# Patient Record
Sex: Female | Born: 2000 | Race: White | Hispanic: No | Marital: Single | Smoking: Never smoker
Health system: Southern US, Community
[De-identification: ages and names within clinical notes are randomized; demographics above are authoritative.]

## PROBLEM LIST (undated history)

## (undated) DIAGNOSIS — R51 Headache: Secondary | ICD-10-CM

## (undated) DIAGNOSIS — F329 Major depressive disorder, single episode, unspecified: Secondary | ICD-10-CM

## (undated) DIAGNOSIS — T7840XA Allergy, unspecified, initial encounter: Secondary | ICD-10-CM

## (undated) DIAGNOSIS — H539 Unspecified visual disturbance: Secondary | ICD-10-CM

## (undated) DIAGNOSIS — F419 Anxiety disorder, unspecified: Secondary | ICD-10-CM

## (undated) DIAGNOSIS — R519 Headache, unspecified: Secondary | ICD-10-CM

## (undated) DIAGNOSIS — J45909 Unspecified asthma, uncomplicated: Secondary | ICD-10-CM

## (undated) DIAGNOSIS — N390 Urinary tract infection, site not specified: Secondary | ICD-10-CM

## (undated) DIAGNOSIS — Z8489 Family history of other specified conditions: Secondary | ICD-10-CM

## (undated) DIAGNOSIS — F32A Depression, unspecified: Secondary | ICD-10-CM

## (undated) DIAGNOSIS — K589 Irritable bowel syndrome without diarrhea: Secondary | ICD-10-CM

## (undated) DIAGNOSIS — R198 Other specified symptoms and signs involving the digestive system and abdomen: Secondary | ICD-10-CM

---
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
## 2021-02-11 LAB — BASIC METABOLIC PANEL
BUN: 11 (ref 4–21)
CO2: 18 (ref 13–22)
Chloride: 107 (ref 99–108)
Creatinine: 0.7 (ref 0.5–1.1)
Glucose: 76
Potassium: 3.9 (ref 3.4–5.3)
Sodium: 139 (ref 137–147)

## 2021-02-11 LAB — HEPATIC FUNCTION PANEL
ALT: 14 (ref 3–30)
AST: 16 (ref 2–40)
Alkaline Phosphatase: 84 (ref 25–125)
Bilirubin, Total: 0.2

## 2021-02-11 LAB — COMPREHENSIVE METABOLIC PANEL
Albumin: 4.2 (ref 3.5–5.0)
Calcium: 9.4 (ref 8.7–10.7)
Globulin: 2.4

## 2021-02-11 LAB — CBC AND DIFFERENTIAL
HCT: 35 — AB (ref 36–46)
Hemoglobin: 11.2 — AB (ref 12.0–16.0)
Platelets: 277 (ref 150–399)
WBC: 9.1

## 2021-02-11 LAB — CBC: RBC: 4.07 (ref 3.87–5.11)

## 2021-04-28 ENCOUNTER — Other Ambulatory Visit: Payer: Self-pay

## 2021-04-28 ENCOUNTER — Encounter: Payer: Self-pay | Admitting: Nurse Practitioner

## 2021-05-20 ENCOUNTER — Encounter: Payer: Self-pay | Admitting: Nurse Practitioner

## 2021-05-20 ENCOUNTER — Other Ambulatory Visit (INDEPENDENT_AMBULATORY_CARE_PROVIDER_SITE_OTHER): Payer: Medicaid Other

## 2021-05-20 ENCOUNTER — Ambulatory Visit (INDEPENDENT_AMBULATORY_CARE_PROVIDER_SITE_OTHER): Payer: Medicaid Other | Admitting: Nurse Practitioner

## 2021-05-20 VITALS — BP 110/60 | HR 72 | Ht 60.5 in | Wt 190.1 lb

## 2021-05-20 DIAGNOSIS — K5900 Constipation, unspecified: Secondary | ICD-10-CM

## 2021-05-20 DIAGNOSIS — R112 Nausea with vomiting, unspecified: Secondary | ICD-10-CM | POA: Insufficient documentation

## 2021-05-20 DIAGNOSIS — D649 Anemia, unspecified: Secondary | ICD-10-CM

## 2021-05-20 DIAGNOSIS — R1031 Right lower quadrant pain: Secondary | ICD-10-CM

## 2021-05-20 DIAGNOSIS — R109 Unspecified abdominal pain: Secondary | ICD-10-CM

## 2021-05-20 DIAGNOSIS — R1013 Epigastric pain: Secondary | ICD-10-CM

## 2021-05-20 DIAGNOSIS — R1010 Upper abdominal pain, unspecified: Secondary | ICD-10-CM | POA: Insufficient documentation

## 2021-05-20 DIAGNOSIS — R197 Diarrhea, unspecified: Secondary | ICD-10-CM

## 2021-05-20 LAB — CBC WITH DIFFERENTIAL/PLATELET
Basophils Absolute: 0 10*3/uL (ref 0.0–0.1)
Basophils Relative: 0.5 % (ref 0.0–3.0)
Eosinophils Absolute: 0.2 10*3/uL (ref 0.0–0.7)
Eosinophils Relative: 2 % (ref 0.0–5.0)
HCT: 33.9 % — ABNORMAL LOW (ref 36.0–49.0)
Hemoglobin: 11.3 g/dL — ABNORMAL LOW (ref 12.0–16.0)
Lymphocytes Relative: 26.5 % (ref 24.0–48.0)
Lymphs Abs: 2.4 10*3/uL (ref 0.7–4.0)
MCHC: 33.2 g/dL (ref 31.0–37.0)
MCV: 81.2 fl (ref 78.0–98.0)
Monocytes Absolute: 0.5 10*3/uL (ref 0.1–1.0)
Monocytes Relative: 5.8 % (ref 3.0–12.0)
Neutro Abs: 5.9 10*3/uL (ref 1.4–7.7)
Neutrophils Relative %: 65.2 % (ref 43.0–71.0)
Platelets: 286 10*3/uL (ref 150.0–575.0)
RBC: 4.18 Mil/uL (ref 3.80–5.70)
RDW: 16.5 % — ABNORMAL HIGH (ref 11.4–15.5)
WBC: 9.1 10*3/uL (ref 4.5–13.5)

## 2021-05-20 LAB — COMPREHENSIVE METABOLIC PANEL
ALT: 12 U/L (ref 0–35)
AST: 13 U/L (ref 0–37)
Albumin: 4.5 g/dL (ref 3.5–5.2)
Alkaline Phosphatase: 101 U/L (ref 47–119)
BUN: 17 mg/dL (ref 6–23)
CO2: 25 mEq/L (ref 19–32)
Calcium: 9.7 mg/dL (ref 8.4–10.5)
Chloride: 105 mEq/L (ref 96–112)
Creatinine, Ser: 0.57 mg/dL (ref 0.40–1.20)
GFR: 132.62 mL/min (ref >60.00)
Glucose, Bld: 78 mg/dL (ref 70–99)
Potassium: 3.7 mEq/L (ref 3.5–5.1)
Sodium: 139 mEq/L (ref 135–145)
Total Bilirubin: 0.3 mg/dL (ref 0.3–1.2)
Total Protein: 7.8 g/dL (ref 6.0–8.3)

## 2021-05-20 LAB — C-REACTIVE PROTEIN: CRP: 2.2 mg/dL (ref 0.5–20.0)

## 2021-05-20 MED ORDER — DICYCLOMINE HCL 10 MG PO CAPS: 10.0000 mg | ORAL_CAPSULE | Freq: Three times a day (TID) | ORAL | 0 refills | Status: AC | PRN

## 2021-05-20 NOTE — Patient Instructions (Addendum)
If you are age 19 or younger, your body mass index should be between 19-25. Your Body mass index is 36.52 kg/m. If this is out of the aformentioned range listed, please consider follow up with your Primary Care Provider.    IMAGING: You will be contacted by [facility] Scheduling (Your caller ID will indicate phone # [phone]) in the next 2 days to schedule your Abdominal CT Scan. If you have not heard from them within 2 business days, please call [facility] Scheduling at [phone] to follow up on the status of your appointment.    LABS:  Lab work has been ordered for you today. Our lab is located in the basement. Press "B" on the elevator. The lab is located at the first door on the left as you exit the elevator.  MEDICATION: We have sent the following medication to your pharmacy for you to pick up at your convenience: Dicyclomine 10 MG every 8 hours as needed for abdominal pain. Ondansetron (ZOFRAN ODT) 4 MG MG disintegrating tablet- Place one tablet under the tongue every 8 hours as needed for nausea or vomiting.  We have scheduled you a follow up with [name] on 07/24/21 at 9:20am.  It was great seeing you today! Thank you for entrusting me with your care and choosing [facility].  [name], CRNP

## 2021-05-20 NOTE — Progress Notes (Signed)
05/20/2021 Janet Whitehead [id number] 04-14-2001   CHIEF COMPLAINT: Chronic nausea, vomiting and abdominal pain   HISTORY OF PRESENT ILLNESS:  Janet Whitehead is an 20 year old female with a past medical history of anxiety, depression, seasonal allergies and asthma. No past surgical history. She presents to our office today as referred by Clemmie Krill PA-C with [facility] for further evaluation regarding chronic nausea, vomiting and abdominal pain.  She is accompanied by her mother.  She reported having nausea and vomiting episodes since she was age 76.  At one point, she vomited shortly after eating any food several times daily.  She underwent an EGD and colonoscopy by Dr. Joycelyn Rua 01/25/2017.  The EGD showed erythema in the gastric antrum.  The colonoscopy showed a normal colon and TI, anal skin tags were noted.  Biopsies were negative for H. pylori, celiac disease and IBD.  She was possibly diagnosed with irritable bowel syndrome and no further work-up was completed.  She stated her nausea/vomiting and abdominal pain episodes never resolved.  She typically has nausea daily.  She vomits nonbloody liquid or partially digested food once every few days.  If she eats food away from home she typically will vomit on that day.  She denies hematemesis.  She does not self-induced vomiting.  She vomits less if she eats only 1 meal daily.  She often feels acid reflux back into her throat.  No dysphagia.  She reports taking Pantoprazole 40 mg once daily for the past 2 months.  She describes having central abdominal pain which radiates up to the gastric area on a daily basis.  She sometimes has lower abdominal and rectal cramping discomfort.  Her bowel pattern varies.  She describes having worse constipation over the past 4 months.  She can go 6 to 7 days without passing a bowel movement.  She then takes a over-the-counter laxative tablet which results in passing nonbloody loose  stool 2-3 times daily for the next week and then she becomes constipated and the cycle repeats.  She eats a fairly bland diet.  She tolerates bland foods such as chicken, rice and mashed potatoes.  She went on a gluten-free diet for 3 months approximately 5 months ago without improvement.  He stopped drinking sodas.  She drinks tea and flavored water.  She intermittently has days of excessive vomiting without a specific trigger.  She was at work and started vomiting numerous times on 04/24/2021, she reported briefly  passing out in the bathroom at work and she presented to [facility] ED for further evaluation.  Labs in the ED showed a WBC 10.4.  Hemoglobin 11.3.  LFTs were normal.  Urine hCG was negative.  Urine drug screen was positive for cannabinoids.  She received Morphine and Phenergan and her symptoms improved so she was discharged home.  She underwent abdominal sonogram 05/10/2021 as ordered by her PCP which was negative.  Mother reports she had colic as an infant.  She reports her sister and father have significant food allergies, further details are unclear.  No family history of celiac disease or IBD.  She has lost 5 to 6 pounds over the past 8 months.  She has a history of migraine headaches for which she takes Excedrin 2 tabs every 4 hours as needed which occurs once every 3 to 4 weeks.  She has worse vomiting during a migraine episode and has difficulty tolerating any liquids or food for 3 to 6 days.  Her menstrual cycles are irregular.  She has a menstrual cycle once every 3 months which results in heavy menstrual bleeding for 6 days.  She has difficulty losing weight despite limited food intake and exercising 3 to 4 days weekly.  CBC Latest Ref Rng & Units 05/20/2021 02/11/2021 11/17/2017  WBC 4.5 - 13.5 K/uL 9.1 9.1 7.7  Hemoglobin 12.0 - 16.0 g/dL 11.3(L) 11.2(A) 12.1  Hematocrit 36.0 - 49.0 % 33.9(L) 35(A) 38.3  Platelets 150.0 - 575.0 K/uL 286.0 277 301    CMP Latest Ref Rng &  Units 05/20/2021 02/11/2021 11/17/2017  Glucose 70 - 99 mg/dL 78 - 84  BUN 6 - 23 mg/dL 17 11 16   Creatinine 0.40 - 1.20 mg/dL 0.57 0.7 0.72  Sodium 135 - 145 mEq/L 139 139 140  Potassium 3.5 - 5.1 mEq/L 3.7 3.9 3.7  Chloride 96 - 112 mEq/L 105 107 108  CO2 19 - 32 mEq/L 25 18 22   Calcium 8.4 - 10.5 mg/dL 9.7 9.4 9.8  Total Protein 6.0 - 8.3 g/dL 7.8 - 8.0  Total Bilirubin 0.3 - 1.2 mg/dL 0.3 - 0.7  Alkaline Phos 47 - 119 U/L 101 84 70  AST 0 - 37 U/L 13 16 13(L)  ALT 0 - 35 U/L 12 14 10(L)  TSH 2.462.   Labs in the ED 04/24/2021: WBC 10.4. Hg 11.3. HCT 35.3. MCV 83.5. PLT 282. Na 138. K 3.6. BUN 21. Cr 1.05. Glu 86. Ca 9.0. T. Bili 0.3. AST 18. ALT 15. Alk phos 96.  Abdominal sono 04/30/2021: COMPARISON: Ultrasound 12/07/2016, HIDA 10/20/2017   FINDINGS:  Gallbladder: No gallstones or wall thickening visualized. No  sonographic Murphy sign noted by sonographer.   Common bile duct: Diameter: 4.1 mm   Liver: No focal lesion identified. Within normal limits in  parenchymal echogenicity. Portal vein is patent on color Doppler  imaging with normal direction of blood flow towards the liver.   IVC: No abnormality visualized.   Pancreas: Visualized portion unremarkable.   Spleen: Size and appearance within normal limits.   Right Kidney: Length: 10.2 cm. Echogenicity within normal limits. No  mass or hydronephrosis visualized.   Left Kidney: Length: 10.8 cm. Echogenicity within normal limits. No  mass or hydronephrosis visualized.   Abdominal aorta: No aneurysm visualized.   Other findings: None.   IMPRESSION:  Unremarkable abdominal ultrasound.    EGD by Dr. Joycelyn Rua 01/25/2017: Normal esophagus. Biopsied. - Erythematous mucosa in the antrum. Biopsied. - Normal examined duodenum. Biopsied  Colonoscopy 01/25/2017: - Anal skin tags - The entire examined colon is normal. Biopsied. - The examined portion of the ileum was normal. Biopsied.  Biopsy Results: 1. Duodenum,  Biopsy - NORMAL DUODENAL MUCOSA. - NO FEATURES OF SPRUE, GIARDIA, ACTIVE INFLAMMATION OR GRANULOMAS. 2. Stomach, biopsy - NORMAL GASTRIC MUCOSA. - WARTHIN-STARRY STAIN NEGATIVE FOR HELICOBACTER PYLORI. - NO ACTIVE INFLAMMATION, INTESTINAL METAPLASIA OR OTHER ABNORMALITIES. 3. Esophagus, biopsy - NORMAL SQUAMOUS MUCOSA. - NO FEATURES OF EOSINOPHILIC ESOPHAGITIS, REFLUX CHANGES OR OTHER ABNORMALITIES. 4. Terminal ileum, biopsy - NORMAL ILEAL MUCOSA. - NO ACTIVE INFLAMMATION OR GRANULOMAS. 5. Colon, biopsy, Right Ascending - NORMAL COLONIC MUCOSA. - NO MICROSCOPIC COLITIS, ACTIVE INFLAMMATION OR GRANULOMAS. 6. Colon, biopsy, Transverse/Descending - NORMAL COLONIC MUCOSA. - NO MICROSCOPIC COLITIS, ACTIVE INFLAMMATION OR GRANULOMAS. 7. Rectum, biopsy, Rectosigmoid - NORMAL COLONIC MUCOSA. - NO MICROSCOPIC COLITIS, ACTIVE INFLAMMATION OR GRANULOMAS.  Past Medical History:  Diagnosis Date  . Allergy    seasonal   . Anxiety   . Asthma   .  Depression   . Family history of adverse reaction to anesthesia    sister had difficulty waking up confusion, combative  . GI symptoms   . Headache   . IBS (irritable bowel syndrome)   . Urinary tract infection    one at age 65  . Vision abnormalities    wears glasses   Past Surgical History:  Procedure Laterality Date  . COLONOSCOPY N/A 01/25/2017   Procedure: COLONOSCOPY;  Surgeon: Joycelyn Rua, MD;  Location: [place];  Service: Gastroenterology;  Laterality: N/A;  . ESOPHAGOGASTRODUODENOSCOPY N/A 01/25/2017   Procedure: ESOPHAGOGASTRODUODENOSCOPY (EGD);  Surgeon: Joycelyn Rua, MD;  Location: [facility];  Service: Gastroenterology;  Laterality: N/A;   Social History: Nonsmoker. No alcohol.  She denies drug use.  Family History: Mother cholecystectomy secondary to gallstones. Father with food allergies, anxiety and depression. Oldest sister with asthma, 2 sisters have food allergies, eczema and ADHD.   Allergies  Allergen Reactions   . Soy Allergy Hives  . Zofran [Ondansetron Hcl] Other (See Comments)    Mother prefers that pt don't have because both mother and sister is highly allergic  . Sulfa Antibiotics Rash      Outpatient Encounter Medications as of 05/20/2021  Medication Sig  . dicyclomine (BENTYL) 10 MG capsule Take 1 capsule (10 mg total) by mouth every 8 (eight) hours as needed for spasms (abdominal pain).  . pantoprazole (PROTONIX) 40 MG tablet Take 1 tablet by mouth daily.   No facility-administered encounter medications on file as of 05/20/2021.   REVIEW OF SYSTEMS:  Gen: + Fatigue and night sweats. No fevers. No weight loss.  CV: + Ankle swelling. Denies chest pain or palpitations. Resp: Denies cough, shortness of breath of hemoptysis.  GI: Denies heartburn, dysphagia, stomach or lower abdominal pain. No diarrhea or constipation.  GU : Denies urinary burning, blood in urine, increased urinary frequency or incontinence. MS: + Back pain.  Derm: Denies rash, itchiness, skin lesions or unhealing ulcers. Psych: Denies depression, anxiety, memory loss, suicidal ideation and confusion. Heme: Denies bruising, bleeding. Neuro:  + migraine headaches. No dizziness or paresthesias. Endo:  + Excessive thirst.   PHYSICAL EXAM: BP 110/60 (BP Location: Left Arm, Patient Position: Sitting, Cuff Size: Normal)   Pulse 72   Ht 5' 0.5" (1.537 m) Comment: height measured without shoes  Wt 190 lb 2 oz (86.2 kg)   LMP 05/14/2021   BMI 36.52 kg/m    Wt Readings from Last 3 Encounters:  05/20/21 190 lb 2 oz (86.2 kg) (97 %, Z= 1.83)*  09/17/20 196 lb (88.9 kg) (97 %, Z= 1.93)*  07/24/20 197 lb 1.6 oz (89.4 kg) (97 %, Z= 1.95)*   * Growth percentiles are based on CDC (Girls, 2-20 Years) data.   Patient denies any chance of pregnancy, reports she is not sexually active.  General: Overweight 20 year old female in no acute distress. Head: Normocephalic and atraumatic. Eyes:  Sclerae non-icteric, conjunctive  pink. Ears: Normal auditory acuity. Mouth: Dentition intact. No ulcers or lesions.  Neck: Supple, no lymphadenopathy or thyromegaly.  Lungs: Clear bilaterally to auscultation without wheezes, crackles or rhonchi. Heart: Regular rate and rhythm. No murmur, rub or gallop appreciated.  Abdomen: Soft, nontender, non distended. No masses. No hepatosplenomegaly. Normoactive bowel sounds x 4 quadrants.  Rectal: Deferred.  Musculoskeletal: Symmetrical with no gross deformities. Skin: Warm and dry. No rash or lesions on visible extremities. Extremities: No edema. Neurological: Alert oriented x 4, no focal deficits.  Psychological:  Alert and cooperative.  Normal mood and affect.  ASSESSMENT AND PLAN:  85.  20 year old female with chronic nausea and vomiting x 3 years.  Acid reflux symptoms for 2 to 3 months.  EGD and colonoscopy in 2018 were unrevealing.  Abdominal sonogram 04/2021 was negative. Patient denies drug use. UDS at [facility] ED positive for Cannabinoids.  -Continue Pantoprazole 40 mg daily -Food allergy panel, CBC, CMP, CRP -CTAP with oral contrast -Discussed possible repeat EGD  2.  Alternating constipation/diarrhea. No rectal bleeding.  -Probiotic of choice  -No plans for a colonoscopy at this time -Await CTAP results   3.  Upper and lower abdominal pain. More frequent upper abdominal pain, less frequent lower abdominal cramping pain.  CTAP with oral contrast only -Dicyclomine 10 mg 1 p.o. 3 times daily as needed for abdominal pain  4.  Mild normocytic anemia, menstrual cycles occur every 3 months with heavy menstrual bleeding for 6 consecutive days.  No overt GI bleeding. -CBC, TTG, IGA, iron, iron saturation, TIBC and ferritin  Further recommendations to be determined after the above lab and CTAP results reviewed  Patient to follow-up with Dr. Havery Moros in 6 to 8 weeks     CC:  [facility]*

## 2021-05-21 LAB — IRON,TIBC AND FERRITIN PANEL
%SAT: 4 % (calc) — ABNORMAL LOW (ref 15–45)
Ferritin: 9 ng/mL (ref 6–67)
Iron: 19 ug/dL — ABNORMAL LOW (ref 27–164)
TIBC: 438 mcg/dL (calc) (ref 271–448)

## 2021-05-21 LAB — IGA: Immunoglobulin A: 167 mg/dL (ref 47–310)

## 2021-05-21 LAB — TISSUE TRANSGLUTAMINASE ABS,IGG,IGA
(tTG) Ab, IgA: <1 U/mL
(tTG) Ab, IgG: <1 U/mL

## 2021-05-21 NOTE — Progress Notes (Signed)
Agree with assessment and plan as outlined. If workup negative would consider GES

## 2021-05-22 ENCOUNTER — Telehealth: Payer: Self-pay | Admitting: Nurse Practitioner

## 2021-05-22 DIAGNOSIS — R112 Nausea with vomiting, unspecified: Secondary | ICD-10-CM

## 2021-05-22 DIAGNOSIS — R1013 Epigastric pain: Secondary | ICD-10-CM

## 2021-05-22 DIAGNOSIS — R1031 Right lower quadrant pain: Secondary | ICD-10-CM

## 2021-05-22 DIAGNOSIS — R109 Unspecified abdominal pain: Secondary | ICD-10-CM

## 2021-05-22 NOTE — Telephone Encounter (Signed)
Order changed and sent

## 2021-05-22 NOTE — Telephone Encounter (Signed)
Radiology called requesting for a new order to be put in the Epic for the upcoming CT needs to be Abd\Pelvic WO contrast not just Abdomen.

## 2021-05-29 ENCOUNTER — Telehealth: Payer: Self-pay | Admitting: Nurse Practitioner

## 2021-05-29 ENCOUNTER — Other Ambulatory Visit: Payer: Self-pay | Admitting: Nurse Practitioner

## 2021-05-29 NOTE — Telephone Encounter (Signed)
Scheduling request sent. Will follow

## 2021-05-29 NOTE — Telephone Encounter (Signed)
Melissa, I spoke with the patient today and she has not scheduled her abdominal/pelvic CT scan.  Refer to the last phone note and it looks like you recently corrected order for the CTAP to radiology.  Please make sure the patient is scheduled for the CT scan as ordered at the time of her office visit thank you.

## 2021-05-29 NOTE — Progress Notes (Signed)
error 

## 2021-06-01 ENCOUNTER — Other Ambulatory Visit: Payer: Self-pay | Admitting: *Deleted

## 2021-06-01 ENCOUNTER — Telehealth: Payer: Self-pay

## 2021-06-01 ENCOUNTER — Other Ambulatory Visit: Payer: Self-pay

## 2021-06-01 DIAGNOSIS — D649 Anemia, unspecified: Secondary | ICD-10-CM

## 2021-06-01 MED ORDER — PROMETHAZINE HCL 12.5 MG PO TABS: 12.5000 mg | ORAL_TABLET | Freq: Two times a day (BID) | ORAL | 0 refills | Status: DC | PRN

## 2021-06-01 NOTE — Telephone Encounter (Signed)
Spoke with the patient. She chooses to have a different medication after discussing options. Phenergan 12.5 I PO BID PRN sent to the [facility] per patient request.

## 2021-06-01 NOTE — Telephone Encounter (Signed)
-----   Message from Janet Natal, NP sent at 06/01/2021  6:40 AM EDT ----- Janet Whitehead, pls contact the patient, refer to [name] notes below. If she is wanting to try Zofran than she would need to take  first dose in the presence of medical staff in our office or with her PCP's office.  If she does not wish to do that then give RX for Phenergan 12.5 mg one tab po bid PRN # 20, no refills.   Pls provide Dr. Adela Whitehead with any updates as I am out of the office today and tomorrow. Thx  ----- Message ----- From: Janet Deeds, MD Sent: 05/29/2021   4:55 PM EDT To: Janet Natal, NP  Hi Janet Whitehead,  I don't think I've heard of that before, do they know what type of reaction it was? We could give her Zofran and she could go to her PCP's office or our office when she takes it for monitoring to ensure okay. Otherwise could give phenergan but would be tough to not use Zofran due to a family history of having an allergic reaction to it without trying it. If she is anxious about it, again could take it under supervision to ensure she tolerates it okay, if concerned about anaphylaxis, etc. Thanks   ----- Message ----- From: Janet Natal, NP Sent: 05/29/2021   2:56 PM EDT To: Janet Deeds, MD  Dr. Adela Whitehead, I the patient is wanting a prescription for Zofran which I attempted to provide at the time of her last office visit.  However, Zofran is listed as an allergy under her profile but the patient stated she has never taken Zofran but her mother and sister had a significant allergic reaction when they took Zofran.   Do have an alternative antiemetic for this 20 year old patient or you comfortable prescribing Zofran with a family history of allergies to this medication as noted above?  Also, this is a patient that had a positive urine drug screen test for marijuana but at the time of her office visit accompanied by her mother she denied any marijuana or drug use.   I spoke to her on the phone today to review her lab results I discussed reviewed the urine drug screen test and she stated she used marijuana once prior to going to the ED but does not use marijuana on a regular basis and does not intend to use it again.

## 2021-06-05 NOTE — Telephone Encounter (Signed)
Spoke with Radiology scheduling and they have left her a VM on 6/22 and today to schedule, patient has not been calling them back.

## 2021-06-05 NOTE — Telephone Encounter (Signed)
Sent note to follow up on the status of this.

## 2021-06-29 NOTE — Telephone Encounter (Signed)
Patient to discuss at follow up.

## 2021-07-24 ENCOUNTER — Ambulatory Visit: Payer: Medicaid Other | Admitting: Gastroenterology

## 2022-01-29 ENCOUNTER — Other Ambulatory Visit: Payer: Self-pay

## 2022-01-29 ENCOUNTER — Ambulatory Visit
Admission: EM | Admit: 2022-01-29 | Discharge: 2022-01-29 | Disposition: A | Discharge status: Home / Self Care | Payer: Medicaid Other | Attending: Urgent Care | Admitting: Urgent Care

## 2022-01-29 DIAGNOSIS — J3489 Other specified disorders of nose and nasal sinuses: Secondary | ICD-10-CM | POA: Diagnosis not present

## 2022-01-29 DIAGNOSIS — R112 Nausea with vomiting, unspecified: Secondary | ICD-10-CM | POA: Insufficient documentation

## 2022-01-29 DIAGNOSIS — B349 Viral infection, unspecified: Secondary | ICD-10-CM | POA: Insufficient documentation

## 2022-01-29 DIAGNOSIS — R070 Pain in throat: Secondary | ICD-10-CM | POA: Diagnosis present

## 2022-01-29 DIAGNOSIS — J3089 Other allergic rhinitis: Secondary | ICD-10-CM | POA: Diagnosis not present

## 2022-01-29 DIAGNOSIS — J4530 Mild persistent asthma, uncomplicated: Secondary | ICD-10-CM | POA: Insufficient documentation

## 2022-01-29 LAB — POCT RAPID STREP A (OFFICE): Rapid Strep A Screen: NEGATIVE

## 2022-01-29 MED ORDER — PSEUDOEPHEDRINE HCL 60 MG PO TABS: 60.0000 mg | ORAL_TABLET | Freq: Three times a day (TID) | ORAL | 0 refills | Status: AC | PRN

## 2022-01-29 MED ORDER — CETIRIZINE HCL 10 MG PO TABS: 10.0000 mg | ORAL_TABLET | Freq: Every day | ORAL | 0 refills | Status: AC

## 2022-01-29 MED ORDER — PREDNISONE 20 MG PO TABS: ORAL_TABLET | ORAL | 0 refills | Status: AC

## 2022-01-29 MED ORDER — BENZONATATE 100 MG PO CAPS
100.0000 mg | ORAL_CAPSULE | Freq: Three times a day (TID) | ORAL | 0 refills | Status: AC | PRN
Start: 2022-01-29 — End: ?

## 2022-01-29 MED ORDER — PROMETHAZINE HCL 25 MG PO TABS: 25.0000 mg | ORAL_TABLET | Freq: Four times a day (QID) | ORAL | 0 refills | Status: AC | PRN

## 2022-01-29 NOTE — ED Provider Notes (Signed)
[facility]   MRN: [id number] DOB: [birth year]  Subjective:   Janet Whitehead is a 21 y.o. female presenting for 3 day history of acute onset runny and stuffy nose, coughing, nausea, vomiting, throat pain.  Patient has a history of allergic rhinitis and asthma.  No chest pain, shortness of breath or wheezing.  No chance of pregnancy as patient is sexually active with 1 female only.  Does not want a COVID test.  No current facility-administered medications for this encounter.  Current Outpatient Medications:    dicyclomine (BENTYL) 10 MG capsule, Take 1 capsule (10 mg total) by mouth every 8 (eight) hours as needed for spasms (abdominal pain)., Disp: 30 capsule, Rfl: 0   pantoprazole (PROTONIX) 40 MG tablet, Take 1 tablet by mouth daily., Disp: , Rfl:    promethazine (PHENERGAN) 12.5 MG tablet, Take 1 tablet (12.5 mg total) by mouth 2 (two) times daily as needed for nausea or vomiting., Disp: 20 tablet, Rfl: 0   Allergies  Allergen Reactions   Soy Allergy Hives   Zofran [Ondansetron Hcl] Other (See Comments)    Mother prefers that pt don't have because both mother and sister is highly allergic   Sulfa Antibiotics Rash    Past Medical History:  Diagnosis Date   Allergy    seasonal    Anxiety    Asthma    Depression    Family history of adverse reaction to anesthesia    sister had difficulty waking up confusion, combative   GI symptoms    Headache    IBS (irritable bowel syndrome)    Urinary tract infection    one at age 49   Vision abnormalities    wears glasses     Past Surgical History:  Procedure Laterality Date   COLONOSCOPY N/A 01/25/2017   Procedure: COLONOSCOPY;  Surgeon: Adelene Amas, MD;  Location: [facility];  Service: Gastroenterology;  Laterality: N/A;   ESOPHAGOGASTRODUODENOSCOPY N/A 01/25/2017   Procedure: ESOPHAGOGASTRODUODENOSCOPY (EGD);  Surgeon: Adelene Amas, MD;  Location: [facility];  Service: Gastroenterology;  Laterality: N/A;     Family History  Problem Relation Age of Onset   Migraines Mother    Anxiety disorder Mother    Gallstones Mother    Asthma Father    Eczema Sister    ADD / ADHD Sister    ADD / ADHD Sister    Gallstones Maternal Grandmother    Diabetes Maternal Grandfather 18   Hypertension Paternal Grandmother    Hypertension Paternal Grandfather    Hypertension Paternal Aunt    Asthma Sister     Social History   Tobacco Use   Smoking status: Never   Smokeless tobacco: Never  Vaping Use   Vaping Use: Never used  Substance Use Topics   Alcohol use: No   Drug use: Never    ROS   Objective:   Vitals: BP 100/71 (BP Location: Right Arm)    Pulse 81    Temp 98.6 F (37 C) (Oral)    Resp 17    LMP  (Within Weeks) Comment: 1 week   SpO2 98%   Physical Exam Constitutional:      General: She is not in acute distress.    Appearance: Normal appearance. She is well-developed and normal weight. She is not ill-appearing, toxic-appearing or diaphoretic.  HENT:     Head: Normocephalic and atraumatic.     Right Ear: Tympanic membrane, ear canal and external ear normal. No drainage, swelling or tenderness. No middle ear  effusion. There is no impacted cerumen. Tympanic membrane is not erythematous.     Left Ear: Tympanic membrane, ear canal and external ear normal. No drainage, swelling or tenderness.  No middle ear effusion. There is no impacted cerumen. Tympanic membrane is not erythematous.     Nose: Congestion and rhinorrhea present.     Mouth/Throat:     Mouth: Mucous membranes are dry. No oral lesions.     Pharynx: No pharyngeal swelling, oropharyngeal exudate, posterior oropharyngeal erythema or uvula swelling.     Tonsils: No tonsillar exudate or tonsillar abscesses.     Comments: Thick postnasal drainage overlying pharynx. Eyes:     General: No scleral icterus.       Right eye: No discharge.        Left eye: No discharge.     Extraocular Movements: Extraocular movements intact.      Right eye: Normal extraocular motion.     Left eye: Normal extraocular motion.     Conjunctiva/sclera: Conjunctivae normal.  Cardiovascular:     Rate and Rhythm: Normal rate.     Heart sounds: No murmur heard.   No friction rub. No gallop.  Pulmonary:     Effort: Pulmonary effort is normal. No respiratory distress.     Breath sounds: No stridor. No wheezing, rhonchi or rales.  Chest:     Chest wall: No tenderness.  Musculoskeletal:     Cervical back: Normal range of motion and neck supple.  Lymphadenopathy:     Cervical: No cervical adenopathy.  Skin:    General: Skin is warm and dry.  Neurological:     General: No focal deficit present.     Mental Status: She is alert and oriented to person, place, and time.  Psychiatric:        Mood and Affect: Mood normal.        Behavior: Behavior normal.    Results for orders placed or performed during the hospital encounter of 01/29/22 (from the past 24 hour(s))  POCT rapid strep A     Status: None   Collection Time: 01/29/22  3:30 PM  Result Value Ref Range   Rapid Strep A Screen Negative Negative    Assessment and Plan :   PDMP not reviewed this encounter.  1. Acute viral syndrome   2. Throat pain   3. Nausea and vomiting, unspecified vomiting type   4. Stuffy and runny nose    Strep culture pending.  Recommended supportive care for an acute viral syndrome.  Use Phenergan for the nausea and vomiting as patient avoids Zofran since all of her first-degree family members are highly allergic to it. Deferred imaging given clear cardiopulmonary exam, hemodynamically stable vital signs.  Will defer the use of prednisone for now despite her history of allergic rhinitis and asthma.  However, if she has no improvement in the next 2 to 3 days recommend she use this.  Counseled patient on potential for adverse effects with medications prescribed/recommended today, ER and return-to-clinic precautions discussed, patient verbalized  understanding.    Wallis Bamberg, [place] 01/29/22 [id number]

## 2022-01-29 NOTE — ED Triage Notes (Signed)
Pt reports nausea,  vomiting, nasal congestion, sore throat and fever x 3 days.

## 2022-02-01 LAB — CULTURE, GROUP A STREP (THRC)

## 2022-05-05 ENCOUNTER — Ambulatory Visit
Admission: EM | Admit: 2022-05-05 | Discharge: 2022-05-05 | Disposition: A | Discharge status: Home / Self Care | Payer: Medicaid Other | Attending: Family Medicine | Admitting: Family Medicine

## 2022-05-05 DIAGNOSIS — J4521 Mild intermittent asthma with (acute) exacerbation: Secondary | ICD-10-CM | POA: Diagnosis not present

## 2022-05-05 DIAGNOSIS — J069 Acute upper respiratory infection, unspecified: Secondary | ICD-10-CM

## 2022-05-05 LAB — POCT RAPID STREP A (OFFICE): Rapid Strep A Screen: NEGATIVE

## 2022-05-05 MED ORDER — AZITHROMYCIN 250 MG PO TABS: ORAL_TABLET | ORAL | 0 refills | Status: AC

## 2022-05-05 MED ORDER — PROMETHAZINE-DM 6.25-15 MG/5ML PO SYRP: 5.0000 mL | ORAL_SOLUTION | Freq: Four times a day (QID) | ORAL | 0 refills | Status: AC | PRN

## 2022-05-05 MED ORDER — ALBUTEROL SULFATE HFA 108 (90 BASE) MCG/ACT IN AERS: 1.0000 | INHALATION_SPRAY | Freq: Four times a day (QID) | RESPIRATORY_TRACT | 0 refills | Status: AC | PRN

## 2022-05-05 NOTE — ED Provider Notes (Signed)
[facility]    CSN: [id number] Arrival date & time: 05/05/22  1039      History   Chief Complaint Chief Complaint  Patient presents with   Fever    Fever, head cold and stopped up    HPI Janet Whitehead is a 21 y.o. female.   Presenting today with over a week of progressively worsening nasal congestion, hacking productive cough, chest tightness, wheezing, fevers off and on with Tmax last night of 101 F.  Also had a sore throat that is causing difficulty swallowing the past few days.  Denies chest pain, abdominal pain, nausea vomiting or diarrhea.  Taking Mucinex and DayQuil with no relief.  No known history of pertinent chronic medical problems.  No known sick contacts recently.   Past Medical History:  Diagnosis Date   Allergy    seasonal    Anxiety    Asthma    Depression    Family history of adverse reaction to anesthesia    sister had difficulty waking up confusion, combative   GI symptoms    Headache    IBS (irritable bowel syndrome)    Urinary tract infection    one at age 69   Vision abnormalities    wears glasses    Patient Active Problem List   Diagnosis Date Noted   Nausea with vomiting 05/20/2021   Upper abdominal pain 05/20/2021   Constipation 05/20/2021   Diarrhea 05/20/2021   Anemia 05/20/2021    Past Surgical History:  Procedure Laterality Date   COLONOSCOPY N/A 01/25/2017   Procedure: COLONOSCOPY;  Surgeon: Joycelyn Rua, MD;  Location: [place];  Service: Gastroenterology;  Laterality: N/A;   ESOPHAGOGASTRODUODENOSCOPY N/A 01/25/2017   Procedure: ESOPHAGOGASTRODUODENOSCOPY (EGD);  Surgeon: Joycelyn Rua, MD;  Location: [place];  Service: Gastroenterology;  Laterality: N/A;    OB History     Gravida      Para      Term      Preterm      AB      Living  0      SAB      IAB      Ectopic      Multiple      Live Births               Home Medications    Prior to Admission medications    Medication Sig Start Date End Date Taking? Authorizing Provider  albuterol (VENTOLIN HFA) 108 (90 Base) MCG/ACT inhaler Inhale 1-2 puffs into the lungs every 6 (six) hours as needed for wheezing or shortness of breath. 05/05/22  Yes Volney American, PA-C  azithromycin (ZITHROMAX) 250 MG tablet Take first 2 tablets together, then 1 every day until finished. 05/05/22  Yes Volney American, PA-C  promethazine-dextromethorphan (PROMETHAZINE-DM) 6.25-15 MG/5ML syrup Take 5 mLs by mouth 4 (four) times daily as needed. 05/05/22  Yes Volney American, PA-C  benzonatate (TESSALON) 100 MG capsule Take 1-2 capsules (100-200 mg total) by mouth 3 (three) times daily as needed for cough. 01/29/22   Jaynee Eagles, PA-C  cetirizine (ZYRTEC ALLERGY) 10 MG tablet Take 1 tablet (10 mg total) by mouth daily. 01/29/22   Jaynee Eagles, PA-C  dicyclomine (BENTYL) 10 MG capsule Take 1 capsule (10 mg total) by mouth every 8 (eight) hours as needed for spasms (abdominal pain). 05/20/21   Noralyn Pick, NP  pantoprazole (PROTONIX) 40 MG tablet Take 1 tablet by mouth daily. 02/11/21   [provider]  predniSONE (DELTASONE) 20 MG tablet Take 2 tablets daily with breakfast. 01/29/22   Jaynee Eagles, PA-C  promethazine (PHENERGAN) 25 MG tablet Take 1 tablet (25 mg total) by mouth every 6 (six) hours as needed for nausea or vomiting. 01/29/22   Jaynee Eagles, PA-C  pseudoephedrine (SUDAFED) 60 MG tablet Take 1 tablet (60 mg total) by mouth every 8 (eight) hours as needed for congestion. 01/29/22   Jaynee Eagles, PA-C    Family History Family History  Problem Relation Age of Onset   Migraines Mother    Anxiety disorder Mother    Gallstones Mother    Asthma Father    Eczema Sister    ADD / ADHD Sister    ADD / ADHD Sister    Gallstones Maternal Grandmother    Diabetes Maternal Grandfather 49   Hypertension Paternal Grandmother    Hypertension Paternal Grandfather    Hypertension Paternal Aunt    Asthma  Sister     Social History Social History   Tobacco Use   Smoking status: Never   Smokeless tobacco: Never  Vaping Use   Vaping Use: Never used  Substance Use Topics   Alcohol use: No   Drug use: Never     Allergies   Soy allergy, Zofran [ondansetron hcl], and Sulfa antibiotics   Review of Systems Review of Systems Per HPI  Physical Exam Triage Vital Signs ED Triage Vitals  Enc Vitals Group     BP 05/05/22 1145 124/72     Pulse Rate 05/05/22 1145 91     Resp 05/05/22 1145 18     Temp 05/05/22 1145 97.9 F (36.6 C)     Temp Source 05/05/22 1145 Oral     SpO2 05/05/22 1145 99 %     Weight --      Height --      Head Circumference --      Peak Flow --      Pain Score 05/05/22 1142 4     Pain Loc --      Pain Edu? --      Excl. in [place]? --    No data found.  Updated Vital Signs BP 124/72 (BP Location: Right Arm)   Pulse 91   Temp 97.9 F (36.6 C) (Oral)   Resp 18   LMP 04/21/2022 (Approximate)   SpO2 99%   Visual Acuity Right Eye Distance:   Left Eye Distance:   Bilateral Distance:    Right Eye Near:   Left Eye Near:    Bilateral Near:     Physical Exam Vitals and nursing note reviewed.  Constitutional:      Appearance: Normal appearance. She is not ill-appearing.  HENT:     Head: Atraumatic.     Nose: Congestion present.     Mouth/Throat:     Mouth: Mucous membranes are moist.     Pharynx: Oropharynx is clear. Posterior oropharyngeal erythema present. No oropharyngeal exudate.  Eyes:     Extraocular Movements: Extraocular movements intact.     Conjunctiva/sclera: Conjunctivae normal.  Cardiovascular:     Rate and Rhythm: Normal rate and regular rhythm.     Heart sounds: Normal heart sounds.  Pulmonary:     Effort: Pulmonary effort is normal.     Breath sounds: Wheezing present. No rales.     Comments: Trace wheezes diffusely Musculoskeletal:        General: Normal range of motion.     Cervical back: Normal range of motion  and neck  supple.  Skin:    General: Skin is warm and dry.  Neurological:     Mental Status: She is alert and oriented to person, place, and time.     Motor: No weakness.     Gait: Gait normal.  Psychiatric:        Mood and Affect: Mood normal.        Thought Content: Thought content normal.        Judgment: Judgment normal.     UC Treatments / Results  Labs (all labs ordered are listed, but only abnormal results are displayed) Labs Reviewed  POCT RAPID STREP A (OFFICE)    EKG   Radiology No results found.  Procedures Procedures (including critical care time)  Medications Ordered in UC Medications - No data to display  Initial Impression / Assessment and Plan / UC Course  I have reviewed the triage vital signs and the nursing notes.  Pertinent labs & imaging results that were available during my care of the patient were reviewed by me and considered in my medical decision making (see chart for details).     Vital signs reassuring today, rapid strep negative but given duration and worsening course will treat with azithromycin, Phenergan DM, albuterol.  Return for worsening symptoms.  Final Clinical Impressions(s) / UC Diagnoses   Final diagnoses:  Upper respiratory tract infection, unspecified type  Mild intermittent asthma with acute exacerbation   Discharge Instructions   None    ED Prescriptions     Medication Sig Dispense Auth. Provider   azithromycin (ZITHROMAX) 250 MG tablet Take first 2 tablets together, then 1 every day until finished. 6 tablet Volney American, [place]   promethazine-dextromethorphan (PROMETHAZINE-DM) 6.25-15 MG/5ML syrup Take 5 mLs by mouth 4 (four) times daily as needed. 100 mL Volney American, PA-C   albuterol (VENTOLIN HFA) 108 (90 Base) MCG/ACT inhaler Inhale 1-2 puffs into the lungs every 6 (six) hours as needed for wheezing or shortness of breath. 18 g Volney American, [place]      PDMP not reviewed this encounter.    Volney American, [place] 05/05/22 1216

## 2022-05-05 NOTE — ED Triage Notes (Signed)
Pt states that a week ago she started having bad congestion with cough and mucus  Pt states she has been having fevers off and on  Pt states she tried Mucinex and Dayquil without any relief

## 2022-09-28 ENCOUNTER — Other Ambulatory Visit: Payer: Self-pay | Admitting: Family Medicine

## 2022-09-28 NOTE — Telephone Encounter (Signed)
Requested Prescriptions  Pending Prescriptions Disp Refills  . VENTOLIN HFA 108 (90 Base) MCG/ACT inhaler [Pharmacy Med Name: VENTOLIN HFA INHALER 18GM] 18 g 0    Sig: INHALE ONE OR TWO PUFFS EVERY 6 HOURS IF NEEDED FOR WHEEZING.     There is no refill protocol information for this order

## 2022-10-05 ENCOUNTER — Other Ambulatory Visit: Payer: Self-pay | Admitting: Family Medicine

## 2022-10-06 NOTE — Telephone Encounter (Signed)
Unable to refill per protocol, last refill by another provider. Provider not at this practice. Will refuse.  Requested Prescriptions  Pending Prescriptions Disp Refills  . VENTOLIN HFA 108 (90 Base) MCG/ACT inhaler [Pharmacy Med Name: VENTOLIN HFA INHALER 18GM] 18 g 0    Sig: INHALE ONE OR TWO PUFFS EVERY 6 HOURS IF NEEDED FOR WHEEZING.     There is no refill protocol information for this order

## 2023-04-20 DIAGNOSIS — F900 Attention-deficit hyperactivity disorder, predominantly inattentive type: Secondary | ICD-10-CM | POA: Diagnosis not present

## 2023-04-20 DIAGNOSIS — J4520 Mild intermittent asthma, uncomplicated: Secondary | ICD-10-CM | POA: Diagnosis not present

## 2023-04-20 DIAGNOSIS — G43911 Migraine, unspecified, intractable, with status migrainosus: Secondary | ICD-10-CM | POA: Diagnosis not present

## 2023-04-20 DIAGNOSIS — F411 Generalized anxiety disorder: Secondary | ICD-10-CM | POA: Diagnosis not present

## 2023-05-13 ENCOUNTER — Emergency Department (HOSPITAL_COMMUNITY)
Admission: EM | Admit: 2023-05-13 | Discharge: 2023-05-13 | Disposition: A | Discharge status: Home / Self Care | Payer: BC Managed Care – PPO | Attending: Emergency Medicine | Admitting: Emergency Medicine

## 2023-05-13 ENCOUNTER — Encounter (HOSPITAL_COMMUNITY): Payer: Self-pay | Admitting: Emergency Medicine

## 2023-05-13 ENCOUNTER — Other Ambulatory Visit: Payer: Self-pay

## 2023-05-13 DIAGNOSIS — J45909 Unspecified asthma, uncomplicated: Secondary | ICD-10-CM | POA: Diagnosis not present

## 2023-05-13 DIAGNOSIS — R202 Paresthesia of skin: Secondary | ICD-10-CM | POA: Insufficient documentation

## 2023-05-13 DIAGNOSIS — E876 Hypokalemia: Secondary | ICD-10-CM

## 2023-05-13 DIAGNOSIS — R519 Headache, unspecified: Secondary | ICD-10-CM | POA: Diagnosis not present

## 2023-05-13 DIAGNOSIS — Z7951 Long term (current) use of inhaled steroids: Secondary | ICD-10-CM | POA: Diagnosis not present

## 2023-05-13 LAB — BASIC METABOLIC PANEL
Anion gap: 9 (ref 5–15)
BUN: 13 mg/dL (ref 6–20)
CO2: 20 mmol/L — ABNORMAL LOW (ref 22–32)
Calcium: 8.8 mg/dL — ABNORMAL LOW (ref 8.9–10.3)
Chloride: 106 mmol/L (ref 98–111)
Creatinine, Ser: 0.58 mg/dL (ref 0.44–1.00)
GFR, Estimated: >60 mL/min (ref >60)
Glucose, Bld: 99 mg/dL (ref 70–99)
Potassium: 3.3 mmol/L — ABNORMAL LOW (ref 3.5–5.1)
Sodium: 135 mmol/L (ref 135–145)

## 2023-05-13 LAB — CBC WITH DIFFERENTIAL/PLATELET
Abs Immature Granulocytes: 0.02 10*3/uL (ref 0.00–0.07)
Basophils Absolute: 0 10*3/uL (ref 0.0–0.1)
Basophils Relative: 0 %
Eosinophils Absolute: 0 10*3/uL (ref 0.0–0.5)
Eosinophils Relative: 0 %
HCT: 38.3 % (ref 36.0–46.0)
Hemoglobin: 12.5 g/dL (ref 12.0–15.0)
Immature Granulocytes: 0 %
Lymphocytes Relative: 5 %
Lymphs Abs: 0.5 10*3/uL — ABNORMAL LOW (ref 0.7–4.0)
MCH: 29.3 pg (ref 26.0–34.0)
MCHC: 32.6 g/dL (ref 30.0–36.0)
MCV: 89.9 fL (ref 80.0–100.0)
Monocytes Absolute: 0.4 10*3/uL (ref 0.1–1.0)
Monocytes Relative: 4 %
Neutro Abs: 9 10*3/uL — ABNORMAL HIGH (ref 1.7–7.7)
Neutrophils Relative %: 91 %
Platelets: 216 10*3/uL (ref 150–400)
RBC: 4.26 MIL/uL (ref 3.87–5.11)
RDW: 13.5 % (ref 11.5–15.5)
WBC: 9.9 10*3/uL (ref 4.0–10.5)
nRBC: 0 % (ref 0.0–0.2)

## 2023-05-13 MED ORDER — KETOROLAC TROMETHAMINE 30 MG/ML IJ SOLN
30.0000 mg | Freq: Once | INTRAMUSCULAR | Status: AC
  Administered 2023-05-13: 30 mg via INTRAVENOUS
  Filled 2023-05-13: qty 1

## 2023-05-13 MED ORDER — POTASSIUM CHLORIDE CRYS ER 20 MEQ PO TBCR
40.0000 meq | EXTENDED_RELEASE_TABLET | Freq: Once | ORAL | Status: AC
Start: 2023-05-13 — End: 2023-05-13
  Administered 2023-05-13: 40 meq via ORAL
  Filled 2023-05-13: qty 2

## 2023-05-13 MED ORDER — DIPHENHYDRAMINE HCL 50 MG/ML IJ SOLN
12.5000 mg | Freq: Once | INTRAMUSCULAR | Status: AC
  Administered 2023-05-13: 12.5 mg via INTRAVENOUS
  Filled 2023-05-13: qty 1

## 2023-05-13 MED ORDER — METOCLOPRAMIDE HCL 10 MG PO TABS: 10.0000 mg | ORAL_TABLET | Freq: Three times a day (TID) | ORAL | 0 refills | Status: AC | PRN

## 2023-05-13 MED ORDER — PROCHLORPERAZINE EDISYLATE 10 MG/2ML IJ SOLN
10.0000 mg | Freq: Once | INTRAMUSCULAR | Status: AC
  Administered 2023-05-13: 10 mg via INTRAVENOUS
  Filled 2023-05-13: qty 2

## 2023-05-13 MED ORDER — SODIUM CHLORIDE 0.9 % IV BOLUS
1000.0000 mL | Freq: Once | INTRAVENOUS | Status: AC
  Administered 2023-05-13: 1000 mL via INTRAVENOUS

## 2023-05-13 NOTE — ED Triage Notes (Signed)
Pt c/o migraine headache x 4 days with n/v, prior hx same. Pt took tylenol and excedrin PTA without relief.

## 2023-05-13 NOTE — Discharge Instructions (Signed)
You have been seen today for your complaint of headache. Your lab work showed low potassium but was otherwise reassuring. Your discharge medications include Reglan.  This is a nausea medicine.  Take it as needed for your nausea at home. Magnesium.  You may take up to 400 mg of magnesium twice a day to prevent headaches. Home care instructions are as follows:  Drink plenty of water Follow up with: Your primary care doctor in 1 week for reevaluation Please seek immediate medical care if you develop any of the following symptoms: Your headache: Becomes severe quickly. Gets worse after moderate to intense physical activity. You have any of these symptoms: Repeated vomiting. Pain or stiffness in your neck. Changes to your vision. Pain in an eye or ear. Problems with speech. Muscular weakness or loss of muscle control. Loss of balance or coordination. You feel faint or pass out. You have confusion. You have a seizure. At this time there does not appear to be the presence of an emergent medical condition, however there is always the potential for conditions to change. Please read and follow the below instructions.  Do not take your medicine if  develop an itchy rash, swelling in your mouth or lips, or difficulty breathing; call 911 and seek immediate emergency medical attention if this occurs.  You may review your lab tests and imaging results in their entirety on your MyChart account.  Please discuss all results of fully with your primary care provider and other specialist at your follow-up visit.  Note: Portions of this text may have been transcribed using voice recognition software. Every effort was made to ensure accuracy; however, inadvertent computerized transcription errors may still be present.

## 2023-05-13 NOTE — ED Provider Notes (Signed)
[place] EMERGENCY DEPARTMENT AT [facility] [address] Provider Note   CSN: [id number] Arrival date & time: 05/13/23  1719     History  Chief Complaint  Patient presents with   Migraine    Janet Whitehead is a 22 y.o. female.  With history of asthma, IBS, anxiety, depression who presents to the ED for evaluation of headache.  States the headache began 4 days ago and has persisted at work.  Was not sudden at onset.  States that it got significantly worse today after she woke up.  He is localized across the forehead and a headband distribution.  Described as an intense ache.  She has associated nausea and vomiting which she reports is typical for her migraines.  She has tried Excedrin and Tylenol at home with minimal improvement.  She denies vision changes, neck stiffness, fevers, chills, numbness, weakness.  Reports bilateral hands are tingling.  Pain is worsened with bright lights and rapid movement of her head.   Migraine Associated symptoms include headaches.       Home Medications Prior to Admission medications   Medication Sig Start Date End Date Taking? Authorizing Provider  metoCLOPramide (REGLAN) 10 MG tablet Take 1 tablet (10 mg total) by mouth every 8 (eight) hours as needed for nausea. 05/13/23  Yes Joelee Snoke, Edsel Petrin, PA-C  albuterol (VENTOLIN HFA) 108 (90 Base) MCG/ACT inhaler Inhale 1-2 puffs into the lungs every 6 (six) hours as needed for wheezing or shortness of breath. 05/05/22   Particia Nearing, PA-C  azithromycin (ZITHROMAX) 250 MG tablet Take first 2 tablets together, then 1 every day until finished. 05/05/22   Particia Nearing, PA-C  benzonatate (TESSALON) 100 MG capsule Take 1-2 capsules (100-200 mg total) by mouth 3 (three) times daily as needed for cough. 01/29/22   Wallis Bamberg, PA-C  cetirizine (ZYRTEC ALLERGY) 10 MG tablet Take 1 tablet (10 mg total) by mouth daily. 01/29/22   Wallis Bamberg, PA-C  dicyclomine (BENTYL) 10 MG capsule Take 1 capsule  (10 mg total) by mouth every 8 (eight) hours as needed for spasms (abdominal pain). 05/20/21   [name], NP  pantoprazole (PROTONIX) 40 MG tablet Take 1 tablet by mouth daily. 02/11/21   [provider]  predniSONE (DELTASONE) 20 MG tablet Take 2 tablets daily with breakfast. 01/29/22   Wallis Bamberg, PA-C  promethazine (PHENERGAN) 25 MG tablet Take 1 tablet (25 mg total) by mouth every 6 (six) hours as needed for nausea or vomiting. 01/29/22   Wallis Bamberg, PA-C  promethazine-dextromethorphan (PROMETHAZINE-DM) 6.25-15 MG/5ML syrup Take 5 mLs by mouth 4 (four) times daily as needed. 05/05/22   Particia Nearing, PA-C  pseudoephedrine (SUDAFED) 60 MG tablet Take 1 tablet (60 mg total) by mouth every 8 (eight) hours as needed for congestion. 01/29/22   Wallis Bamberg, PA-C      Allergies    Soy allergy, Zofran [ondansetron hcl], and Sulfa antibiotics    Review of Systems   Review of Systems  Neurological:  Positive for headaches.  All other systems reviewed and are negative.   Physical Exam Updated Vital Signs BP 100/68   Pulse 94   Temp 99.3 F (37.4 C) (Oral)   Resp 17   Ht 5' (1.524 m)   Wt 87.1 kg   LMP 04/23/2023 (Approximate)   SpO2 99%   BMI 37.50 kg/m  Physical Exam Vitals and nursing note reviewed.  Constitutional:      General: She is in acute distress.  Appearance: She is well-developed. She is not ill-appearing, toxic-appearing or diaphoretic.     Comments: Appears uncomfortable.  Resting in bed  HENT:     Head: Normocephalic and atraumatic.  Eyes:     Extraocular Movements: Extraocular movements intact.     Conjunctiva/sclera: Conjunctivae normal.     Pupils: Pupils are equal, round, and reactive to light.  Neck:     Comments: Negative Kernig's and Brudzinski's Cardiovascular:     Rate and Rhythm: Normal rate and regular rhythm.     Heart sounds: No murmur heard. Pulmonary:     Effort: Pulmonary effort is normal. No respiratory distress.      Breath sounds: Normal breath sounds.  Abdominal:     Palpations: Abdomen is soft.     Tenderness: There is no abdominal tenderness.  Musculoskeletal:        General: No swelling.     Cervical back: Neck supple.  Skin:    General: Skin is warm and dry.     Capillary Refill: Capillary refill takes less than 2 seconds.  Neurological:     General: No focal deficit present.     Mental Status: She is alert and oriented to person, place, and time.     Comments:   MENTAL STATUS: AAOx3   LANG/SPEECH: Fluent, intact naming, repetition & comprehension   CRANIAL NERVES:   II: Pupils equal and reactive   III, IV, VI: EOM intact, no gaze preference or deviation, no nystagmus   V: normal sensation of the face   VII: no facial asymmetry   VIII: normal hearing to speech   MOTOR: 5/5 in both upper and lower extremities   SENSORY: Normal to touch in all extremiteis   COORD: Normal finger to nose, heel to shin and shoulder shrug, no tremor, no dysmetria. No pronator drift   Psychiatric:        Mood and Affect: Mood normal.        Behavior: Behavior normal.     ED Results / Procedures / Treatments   Labs (all labs ordered are listed, but only abnormal results are displayed) Labs Reviewed  CBC WITH DIFFERENTIAL/PLATELET - Abnormal; Notable for the following components:      Result Value   Neutro Abs 9.0 (*)    Lymphs Abs 0.5 (*)    All other components within normal limits  BASIC METABOLIC PANEL - Abnormal; Notable for the following components:   Potassium 3.3 (*)    CO2 20 (*)    Calcium 8.8 (*)    All other components within normal limits  I-STAT BETA HCG BLOOD, ED ([facility], [facility], [facility] ONLY)    EKG None  Radiology No results found.  Procedures Procedures    Medications Ordered in ED Medications  sodium chloride 0.9 % bolus 1,000 mL (0 mLs Intravenous Stopped 05/13/23 2149)  ketorolac (TORADOL) 30 MG/ML injection 30 mg (30 mg Intravenous Given 05/13/23 2021)  prochlorperazine  (COMPAZINE) injection 10 mg (10 mg Intravenous Given 05/13/23 2021)  diphenhydrAMINE (BENADRYL) injection 12.5 mg (12.5 mg Intravenous Given 05/13/23 2020)  potassium chloride SA (KLOR-CON M) CR tablet 40 mEq (40 mEq Oral Given 05/13/23 2239)    ED Course/ Medical Decision Making/ A&P                             Medical Decision Making Amount and/or Complexity of Data Reviewed Labs: ordered.  Risk Prescription drug management.  This patient presents to  the ED for concern of headache, this involves an extensive number of treatment options, and is a complaint that carries with it a high risk of complications and morbidity. Emergent considerations for headache include subarachnoid hemorrhage, meningitis, temporal arteritis, glaucoma, cerebral ischemia, carotid/vertebral dissection, intracranial tumor, Venous sinus thrombosis, carbon monoxide poisoning, acute or chronic subdural hemorrhage.  Other considerations include: Migraine, Cluster headache, Hypertension, Caffeine, alcohol, or drug withdrawal, Pseudotumor cerebri, Arteriovenous malformation, Head injury, Neurocysticercosis, Post-lumbar puncture, Preeclampsia, Tension headache, Sinusitis, Cervical arthritis, Refractive error causing strain, Dental abscess, Otitis media, Temporomandibular joint syndrome, Depression, Somatoform disorder (eg, somatization) Trigeminal neuralgia, Glossopharyngeal neuralgia.   Co morbidities that complicate the patient evaluation  asthma, IBS, anxiety, depression  My initial workup includes labs, symptom control  Additional history obtained from: Nursing notes from this visit.  I ordered, reviewed and interpreted labs which include: CBC, BMP, hCG  Afebrile, hemodynamically stable.  22 year old female presents to the ED for evaluation of headache.  States it feels similar to previous headaches she has had in the past, however slightly worse.  On exam, she was initially in some distress.  Her neurologic exam is  reassuring.  No evidence of meningitis on exam.  Her lab workup was overall reassuring as well.  No leukocytosis.  Mild hypokalemia.  Likely secondary to her vomiting.  She reported resolution of her symptoms while in the ED.  Overall very low suspicion for acute intracranial abnormalities as the cause of her pain.  Patient tolerated p.o. intake in the ED without difficulty.  She was encouraged to follow-up with her primary care provider within the next 5 days regarding her symptoms.  She was given strict return precautions.  Stable at discharge.  At this time there does not appear to be any evidence of an acute emergency medical condition and the patient appears stable for discharge with appropriate outpatient follow up. Diagnosis was discussed with patient who verbalizes understanding of care plan and is agreeable to discharge. I have discussed return precautions with patient who verbalizes understanding. Patient encouraged to follow-up with their PCP within 1 week. All questions answered.  Note: Portions of this report may have been transcribed using voice recognition software. Every effort was made to ensure accuracy; however, inadvertent computerized transcription errors may still be present.        Final Clinical Impression(s) / ED Diagnoses Final diagnoses:  Bad headache  Hypokalemia    Rx / DC Orders ED Discharge Orders          Ordered    metoCLOPramide (REGLAN) 10 MG tablet  Every 8 hours PRN        05/13/23 2203              Michelle Piper, PA-C 05/13/23 2254    Vanetta Mulders, MD 05/14/23 [phone]

## 2023-05-14 ENCOUNTER — Emergency Department (HOSPITAL_COMMUNITY)
Admission: EM | Admit: 2023-05-14 | Discharge: 2023-05-14 | Disposition: A | Discharge status: Home / Self Care | Payer: BC Managed Care – PPO | Attending: Emergency Medicine | Admitting: Emergency Medicine

## 2023-05-14 ENCOUNTER — Other Ambulatory Visit: Payer: Self-pay

## 2023-05-14 ENCOUNTER — Encounter (HOSPITAL_COMMUNITY): Payer: Self-pay

## 2023-05-14 DIAGNOSIS — G43001 Migraine without aura, not intractable, with status migrainosus: Secondary | ICD-10-CM | POA: Insufficient documentation

## 2023-05-14 DIAGNOSIS — R112 Nausea with vomiting, unspecified: Secondary | ICD-10-CM | POA: Diagnosis not present

## 2023-05-14 DIAGNOSIS — G43009 Migraine without aura, not intractable, without status migrainosus: Secondary | ICD-10-CM | POA: Diagnosis not present

## 2023-05-14 LAB — COMPREHENSIVE METABOLIC PANEL
ALT: 15 U/L (ref 0–44)
AST: 16 U/L (ref 15–41)
Albumin: 3.8 g/dL (ref 3.5–5.0)
Alkaline Phosphatase: 56 U/L (ref 38–126)
Anion gap: 12 (ref 5–15)
BUN: 9 mg/dL (ref 6–20)
CO2: 19 mmol/L — ABNORMAL LOW (ref 22–32)
Calcium: 9.3 mg/dL (ref 8.9–10.3)
Chloride: 104 mmol/L (ref 98–111)
Creatinine, Ser: 0.63 mg/dL (ref 0.44–1.00)
GFR, Estimated: >60 mL/min (ref >60)
Glucose, Bld: 105 mg/dL — ABNORMAL HIGH (ref 70–99)
Potassium: 4 mmol/L (ref 3.5–5.1)
Sodium: 135 mmol/L (ref 135–145)
Total Bilirubin: 0.6 mg/dL (ref 0.3–1.2)
Total Protein: 7.1 g/dL (ref 6.5–8.1)

## 2023-05-14 LAB — MAGNESIUM: Magnesium: 2.1 mg/dL (ref 1.7–2.4)

## 2023-05-14 LAB — CBC
HCT: 32.4 % — ABNORMAL LOW (ref 36.0–46.0)
Hemoglobin: 10.3 g/dL — ABNORMAL LOW (ref 12.0–15.0)
MCH: 28.5 pg (ref 26.0–34.0)
MCHC: 31.8 g/dL (ref 30.0–36.0)
MCV: 89.8 fL (ref 80.0–100.0)
Platelets: 250 10*3/uL (ref 150–400)
RBC: 3.61 MIL/uL — ABNORMAL LOW (ref 3.87–5.11)
RDW: 13.8 % (ref 11.5–15.5)
WBC: 7.6 10*3/uL (ref 4.0–10.5)
nRBC: 0 % (ref 0.0–0.2)

## 2023-05-14 MED ORDER — DIPHENHYDRAMINE HCL 50 MG/ML IJ SOLN
25.0000 mg | Freq: Once | INTRAMUSCULAR | Status: AC
  Administered 2023-05-14: 25 mg via INTRAVENOUS
  Filled 2023-05-14: qty 1

## 2023-05-14 MED ORDER — MAGNESIUM SULFATE 2 GM/50ML IV SOLN
2.0000 g | Freq: Once | INTRAVENOUS | Status: AC
  Administered 2023-05-14: 2 g via INTRAVENOUS

## 2023-05-14 MED ORDER — KETOROLAC TROMETHAMINE 15 MG/ML IJ SOLN
15.0000 mg | Freq: Once | INTRAMUSCULAR | Status: AC
  Administered 2023-05-14: 15 mg via INTRAVENOUS
  Filled 2023-05-14: qty 1

## 2023-05-14 MED ORDER — MAGNESIUM SULFATE 2 GM/50ML IV SOLN
2.0000 g | Freq: Once | INTRAVENOUS | Status: DC
  Filled 2023-05-14: qty 50

## 2023-05-14 MED ORDER — MAGNESIUM SULFATE IN D5W 1-5 GM/100ML-% IV SOLN: 1.0000 g | Freq: Once | INTRAVENOUS | Status: DC

## 2023-05-14 MED ORDER — PROMETHAZINE HCL 25 MG PO TABS
25.0000 mg | ORAL_TABLET | Freq: Once | ORAL | Status: AC
  Administered 2023-05-14: 25 mg via ORAL
  Filled 2023-05-14: qty 1

## 2023-05-14 MED ORDER — DEXAMETHASONE SODIUM PHOSPHATE 10 MG/ML IJ SOLN
10.0000 mg | Freq: Once | INTRAMUSCULAR | Status: AC
Start: 2023-05-14 — End: 2023-05-14
  Administered 2023-05-14: 10 mg via INTRAMUSCULAR
  Filled 2023-05-14: qty 1

## 2023-05-14 MED ORDER — SODIUM CHLORIDE 0.9 % IV BOLUS
1000.0000 mL | Freq: Once | INTRAVENOUS | Status: AC
  Administered 2023-05-14: 1000 mL via INTRAVENOUS

## 2023-05-14 MED ORDER — LIDOCAINE 5 % EX PTCH
1.0000 | MEDICATED_PATCH | CUTANEOUS | Status: DC
  Administered 2023-05-14: 1 via TRANSDERMAL
  Filled 2023-05-14: qty 1

## 2023-05-14 NOTE — Discharge Instructions (Addendum)
I am glad you are feeling better.  Magnesium levels were within normal limits today.  Blood labs are reassuring.  I gave you a migraine cocktail that consisted of Toradol, Decadron, Phenergan, Benadryl, magnesium, and a lidocaine patch.  I recommend following up with your primary care provider or neurologist listed in this discharge paperwork for follow-up on migraine prophylaxis.  Seek emergency care if experiencing any new or worsening symptoms such as loss of consciousness, severe fever, severe lethargy.

## 2023-05-14 NOTE — ED Provider Notes (Signed)
[facility] AT [facility] Provider Note   CSN: [id number] Arrival date & time: 05/14/23  1045     History  Chief Complaint  Patient presents with   Migraine    Janet Whitehead is a 22 y.o. female with past medical history of migraines who presents to the emergency room complaining of migraine.  Migraine started 5 days ago while at work and progressed in severity over the past week - becoming severe yesterday.  Patient presented to [facility] emergency room yesterday, received migraine cocktail, and reports mild resolution of her migraine.  Patient stating that migraine is severe again today and endorses nausea and non-bloody vomiting.  Patient not on any migraine preventative medication due to cost.  Denies fever, chills, seizures, AMS, lethargy, dyspnea, tachypnea, rash, joint pain, cough, chest pain, abdominal pain  Migraine Associated symptoms include headaches.       Home Medications Prior to Admission medications   Medication Sig Start Date End Date Taking? Authorizing Provider  albuterol (VENTOLIN HFA) 108 (90 Base) MCG/ACT inhaler Inhale 1-2 puffs into the lungs every 6 (six) hours as needed for wheezing or shortness of breath. 05/05/22   Particia Nearing, PA-C  azithromycin (ZITHROMAX) 250 MG tablet Take first 2 tablets together, then 1 every day until finished. 05/05/22   Particia Nearing, PA-C  benzonatate (TESSALON) 100 MG capsule Take 1-2 capsules (100-200 mg total) by mouth 3 (three) times daily as needed for cough. 01/29/22   Wallis Bamberg, PA-C  cetirizine (ZYRTEC ALLERGY) 10 MG tablet Take 1 tablet (10 mg total) by mouth daily. 01/29/22   Wallis Bamberg, PA-C  dicyclomine (BENTYL) 10 MG capsule Take 1 capsule (10 mg total) by mouth every 8 (eight) hours as needed for spasms (abdominal pain). 05/20/21   [name], NP  metoCLOPramide (REGLAN) 10 MG tablet Take 1 tablet (10 mg total) by mouth every 8 (eight) hours as  needed for nausea. 05/13/23   Schutt, Edsel Petrin, PA-C  pantoprazole (PROTONIX) 40 MG tablet Take 1 tablet by mouth daily. 02/11/21   [provider]  predniSONE (DELTASONE) 20 MG tablet Take 2 tablets daily with breakfast. 01/29/22   Wallis Bamberg, PA-C  promethazine (PHENERGAN) 25 MG tablet Take 1 tablet (25 mg total) by mouth every 6 (six) hours as needed for nausea or vomiting. 01/29/22   Wallis Bamberg, PA-C  promethazine-dextromethorphan (PROMETHAZINE-DM) 6.25-15 MG/5ML syrup Take 5 mLs by mouth 4 (four) times daily as needed. 05/05/22   Particia Nearing, PA-C  pseudoephedrine (SUDAFED) 60 MG tablet Take 1 tablet (60 mg total) by mouth every 8 (eight) hours as needed for congestion. 01/29/22   Wallis Bamberg, PA-C      Allergies    Soy allergy, Zofran [ondansetron hcl], and Sulfa antibiotics    Review of Systems   Review of Systems  Neurological:  Positive for headaches.    Physical Exam Updated Vital Signs BP 105/68   Pulse 64   Temp 98.5 F (36.9 C) (Oral)   Resp 18   Ht 5' (1.524 m)   Wt 87.1 kg   LMP 04/23/2023 (Approximate)   SpO2 100%   BMI 37.50 kg/m  Physical Exam Vitals and nursing note reviewed.  Constitutional:      General: She is not in acute distress.    Appearance: She is not toxic-appearing.  HENT:     Head: Normocephalic and atraumatic.     Mouth/Throat:     Mouth: Mucous membranes are moist.  Eyes:     General: No scleral icterus.       Right eye: No discharge.        Left eye: No discharge.     Conjunctiva/sclera: Conjunctivae normal.  Neck:     Comments: ROM normal Cardiovascular:     Rate and Rhythm: Normal rate and regular rhythm.     Pulses: Normal pulses.     Heart sounds: Normal heart sounds. No murmur heard. Pulmonary:     Effort: Pulmonary effort is normal. No respiratory distress.     Breath sounds: Normal breath sounds. No wheezing, rhonchi or rales.  Abdominal:     General: Abdomen is flat. Bowel sounds are normal. There is no  distension.     Palpations: Abdomen is soft. There is no mass.     Tenderness: There is no abdominal tenderness.  Musculoskeletal:     Cervical back: Neck supple.     Right lower leg: No edema.     Left lower leg: No edema.  Skin:    General: Skin is warm and dry.     Findings: No rash.  Neurological:     General: No focal deficit present.     Mental Status: She is alert. Mental status is at baseline.     Comments: GCS 15. Speech is goal oriented. No deficits appreciated to CN III-XII; symmetric eyebrow raise, no facial drooping, tongue midline. Patient has equal grip strength bilaterally with 5/5 strength against resistance in all major muscle groups bilaterally. Sensation to light touch intact. Patient moves extremities without ataxia. Normal finger-nose-finger and heal-to-shin. Negative Kernig sign.   Psychiatric:        Mood and Affect: Mood normal.        Behavior: Behavior normal.     ED Results / Procedures / Treatments   Labs (all labs ordered are listed, but only abnormal results are displayed) Labs Reviewed  CBC - Abnormal; Notable for the following components:      Result Value   RBC 3.61 (*)    Hemoglobin 10.3 (*)    HCT 32.4 (*)    All other components within normal limits  COMPREHENSIVE METABOLIC PANEL - Abnormal; Notable for the following components:   CO2 19 (*)    Glucose, Bld 105 (*)    All other components within normal limits  MAGNESIUM    EKG None  Radiology No results found.  Procedures Procedures    Medications Ordered in ED Medications  lidocaine (LIDODERM) 5 % 1 patch (1 patch Transdermal Patch Applied 05/14/23 1215)  magnesium sulfate IVPB 2 g 50 mL (has no administration in time range)  dexamethasone (DECADRON) injection 10 mg (10 mg Intramuscular Given 05/14/23 1215)  diphenhydrAMINE (BENADRYL) injection 25 mg (25 mg Intravenous Given 05/14/23 1211)  ketorolac (TORADOL) 15 MG/ML injection 15 mg (15 mg Intravenous Given 05/14/23 1214)   promethazine (PHENERGAN) tablet 25 mg (25 mg Oral Given 05/14/23 1214)  sodium chloride 0.9 % bolus 1,000 mL (1,000 mLs Intravenous New Bag/Given 05/14/23 1214)    ED Course/ Medical Decision Making/ A&P                             Medical Decision Making Amount and/or Complexity of Data Reviewed Labs: ordered.  Risk Prescription drug management.   This patient presents to the ED for concern of headache, this involves an extensive number of treatment options, and is a complaint that carries with it a  high risk of complications and morbidity.  The differential diagnosis includes migraine, tension headache, cluster headache, subarachnoid hemorrhage, meningitis/encephalitis, acute angle closure glaucoma, giant cell arteritis, idiopathic intercranial hypertension, ischemic stroke, ICH, cervical artery dissection   Co morbidities that complicate the patient evaluation  none    Lab Tests:  I Ordered, and personally interpreted labs.  The pertinent results include:   -CMP: no concern for electrolyte abnormality; no concern for kidney/liver damage -CBC: No concern for leukocytosis; mild anemia -Mag: 2.1     Problem List / ED Course / Critical interventions / Medication management  Patient presents to ED with migraine.  Patient with past history of migraines.  Patient's migraine gradually progressed in severity over the course of 5 days.  Neuroexam unremarkable.  Patient stating that her her neck became stiff yesterday-however given her stable vitals and lack of fever, AMS, lethargy, rashes, and negative Kernig sign, I am less concerned of meningitis at this time.  Patient's migraine cocktail at [facility] emergency room did not include steroids yesterday-I believe steroids can help resolve her headache and prevent future rebound headaches. Provided patient with migraine cocktail: Decadron, Toradol, Phenergan, Benadryl, fluid bolus, magnesium, lidocaine patch.  Patient reports that  migraine went from 10/10 in severity to 3/10 within 1 hour.  Will reassess patient after she receives her dose of magnesium.  Patient stating that migraine has resolved.  Patient afebrile with stable vital signs.  Patient stating that she is ready to go home.  Patient with information to follow-up with neurology.  Also recommended patient follow-up with primary care provider for migraine prophylaxis. I have reviewed the patients home medicines and have made adjustments as needed Patient was given return precautions. Patient stable for discharge at this time.  Patient verbalized understanding of plan.  Ddx: these are considered less likely due to history of present illness and physical exam -subarachnoid hemorrhage: no neurodeficits -meningitis/encephalitis: stable vital signs,  lack of meningismus symptoms -acute angle closure glaucoma: no eye pain/redness, vision loss, nausea, vomiting -giant cell arteritis: no temporal/jaw pain; fever, fatigue, weight loss, vision loss -idiopathic intercranial hypertension: no changes in vision, nausea -ischemic stroke/ICH/cervical artery dissection: no neurodeficits     Social Determinants of Health:  none           Final Clinical Impression(s) / ED Diagnoses Final diagnoses:  Migraine without aura and with status migrainosus, not intractable    Rx / DC Orders ED Discharge Orders     None         Dorthy Cooler, [place] 05/14/23 1407    Tanda Rockers A, DO 05/15/23 1107

## 2023-05-14 NOTE — ED Triage Notes (Signed)
Pt came to ED for migraine. Pt states she went to [facility] yesterday for same and they gave her Toradol which hasn't helped. Pt states she can barely life head up. Hx of migraines but does not take meds. Pt feels dizzy.
